# Patient Record
Sex: Male | Born: 1935 | Race: White | Hispanic: No | Marital: Married
Health system: Southern US, Community
[De-identification: ages and names within clinical notes are randomized; demographics above are authoritative.]

---

## 2003-02-11 ENCOUNTER — Other Ambulatory Visit: Payer: Self-pay

## 2005-06-28 ENCOUNTER — Ambulatory Visit: Payer: Self-pay | Admitting: Internal Medicine

## 2005-07-23 ENCOUNTER — Ambulatory Visit: Payer: Self-pay | Admitting: Internal Medicine

## 2006-01-31 ENCOUNTER — Ambulatory Visit: Payer: Self-pay | Admitting: Internal Medicine

## 2007-12-27 ENCOUNTER — Emergency Department: Payer: Self-pay | Admitting: Emergency Medicine

## 2007-12-27 ENCOUNTER — Other Ambulatory Visit: Payer: Self-pay

## 2008-12-08 ENCOUNTER — Ambulatory Visit: Payer: Self-pay | Admitting: Unknown Physician Specialty

## 2008-12-14 ENCOUNTER — Ambulatory Visit: Payer: Self-pay | Admitting: Unknown Physician Specialty

## 2009-03-08 ENCOUNTER — Ambulatory Visit: Payer: Self-pay | Admitting: Internal Medicine

## 2009-06-21 ENCOUNTER — Ambulatory Visit: Payer: Self-pay | Admitting: Internal Medicine

## 2009-07-12 ENCOUNTER — Ambulatory Visit: Payer: Self-pay | Admitting: Internal Medicine

## 2009-07-18 ENCOUNTER — Ambulatory Visit: Payer: Self-pay | Admitting: Internal Medicine

## 2009-08-22 ENCOUNTER — Ambulatory Visit: Payer: Self-pay | Admitting: Neurology

## 2009-10-23 ENCOUNTER — Ambulatory Visit: Payer: Self-pay | Admitting: Gastroenterology

## 2010-07-21 ENCOUNTER — Emergency Department: Payer: Self-pay | Admitting: Emergency Medicine

## 2010-07-25 ENCOUNTER — Encounter: Payer: Self-pay | Admitting: Internal Medicine

## 2010-08-08 ENCOUNTER — Encounter: Payer: Self-pay | Admitting: Internal Medicine

## 2010-11-08 ENCOUNTER — Other Ambulatory Visit: Payer: Self-pay | Admitting: Internal Medicine

## 2010-12-05 ENCOUNTER — Ambulatory Visit: Payer: Self-pay | Admitting: Internal Medicine

## 2011-02-08 ENCOUNTER — Ambulatory Visit: Payer: Self-pay | Admitting: Internal Medicine

## 2011-04-07 ENCOUNTER — Inpatient Hospital Stay: Payer: Self-pay | Admitting: Internal Medicine

## 2011-04-10 ENCOUNTER — Ambulatory Visit: Payer: Self-pay | Admitting: Internal Medicine

## 2011-04-10 LAB — BASIC METABOLIC PANEL
Anion Gap: 14 (ref 7–16)
BUN: 37 mg/dL — ABNORMAL HIGH (ref 7–18)
Chloride: 106 mmol/L (ref 98–107)
Co2: 21 mmol/L (ref 21–32)
Creatinine: 1.44 mg/dL — ABNORMAL HIGH (ref 0.60–1.30)
EGFR (African American): >60
EGFR (Non-African Amer.): 50 — ABNORMAL LOW
Glucose: 101 mg/dL — ABNORMAL HIGH (ref 65–99)
Potassium: 3.7 mmol/L (ref 3.5–5.1)
Sodium: 141 mmol/L (ref 136–145)

## 2011-04-10 LAB — CBC WITH DIFFERENTIAL/PLATELET
Basophil #: 0 10*3/uL (ref 0.0–0.1)
Basophil %: 0 %
Eosinophil #: 0 10*3/uL (ref 0.0–0.7)
HCT: 33.9 % — ABNORMAL LOW (ref 40.0–52.0)
HGB: 11.2 g/dL — ABNORMAL LOW (ref 13.0–18.0)
Lymphocyte #: 0.1 10*3/uL — ABNORMAL LOW (ref 1.0–3.6)
Lymphocyte %: 1.7 %
MCHC: 33.1 g/dL (ref 32.0–36.0)
Monocyte %: 2.1 %
Neutrophil #: 5.6 10*3/uL (ref 1.4–6.5)
Platelet: 96 10*3/uL — ABNORMAL LOW (ref 150–440)
RDW: 14.6 % — ABNORMAL HIGH (ref 11.5–14.5)

## 2011-04-11 LAB — BASIC METABOLIC PANEL
Anion Gap: 12 (ref 7–16)
BUN: 42 mg/dL — ABNORMAL HIGH (ref 7–18)
Calcium, Total: 8.2 mg/dL — ABNORMAL LOW (ref 8.5–10.1)
EGFR (African American): >60
EGFR (Non-African Amer.): >60
Glucose: 132 mg/dL — ABNORMAL HIGH (ref 65–99)
Osmolality: 299 (ref 275–301)
Potassium: 3.9 mmol/L (ref 3.5–5.1)

## 2011-04-11 LAB — CBC WITH DIFFERENTIAL/PLATELET
Basophil %: 0 %
Eosinophil %: 0 %
HCT: 33.6 % — ABNORMAL LOW (ref 40.0–52.0)
Lymphocyte #: 0.3 10*3/uL — ABNORMAL LOW (ref 1.0–3.6)
MCV: 93 fL (ref 80–100)
Monocyte #: 0.2 10*3/uL (ref 0.0–0.7)
Monocyte %: 3.2 %
Neutrophil #: 7.2 10*3/uL — ABNORMAL HIGH (ref 1.4–6.5)
Platelet: 97 10*3/uL — ABNORMAL LOW (ref 150–440)
RBC: 3.63 10*6/uL — ABNORMAL LOW (ref 4.40–5.90)
WBC: 7.8 10*3/uL (ref 3.8–10.6)

## 2011-04-12 LAB — CBC WITH DIFFERENTIAL/PLATELET
Basophil #: 0 10*3/uL (ref 0.0–0.1)
Eosinophil #: 0 10*3/uL (ref 0.0–0.7)
Eosinophil %: 0 %
Lymphocyte %: 2.5 %
MCH: 30.5 pg (ref 26.0–34.0)
MCV: 92 fL (ref 80–100)
Monocyte %: 3.9 %
Neutrophil #: 8.4 10*3/uL — ABNORMAL HIGH (ref 1.4–6.5)
Neutrophil %: 93.6 %
Platelet: 111 10*3/uL — ABNORMAL LOW (ref 150–440)
RBC: 3.68 10*6/uL — ABNORMAL LOW (ref 4.40–5.90)
RDW: 15.6 % — ABNORMAL HIGH (ref 11.5–14.5)
WBC: 8.9 10*3/uL (ref 3.8–10.6)

## 2011-04-12 LAB — BASIC METABOLIC PANEL
Anion Gap: 10 (ref 7–16)
BUN: 48 mg/dL — ABNORMAL HIGH (ref 7–18)
Calcium, Total: 8.5 mg/dL (ref 8.5–10.1)
Co2: 23 mmol/L (ref 21–32)
Creatinine: 1.3 mg/dL (ref 0.60–1.30)
EGFR (African American): >60
EGFR (Non-African Amer.): 56 — ABNORMAL LOW
Glucose: 101 mg/dL — ABNORMAL HIGH (ref 65–99)
Sodium: 147 mmol/L — ABNORMAL HIGH (ref 136–145)

## 2011-04-13 LAB — COMPREHENSIVE METABOLIC PANEL
Alkaline Phosphatase: 14 U/L — ABNORMAL LOW (ref 50–136)
Anion Gap: 10 (ref 7–16)
BUN: 54 mg/dL — ABNORMAL HIGH (ref 7–18)
Bilirubin,Total: 0.4 mg/dL (ref 0.2–1.0)
Chloride: 113 mmol/L — ABNORMAL HIGH (ref 98–107)
Creatinine: 1.28 mg/dL (ref 0.60–1.30)
EGFR (African American): >60
Glucose: 84 mg/dL (ref 65–99)
SGOT(AST): 53 U/L — ABNORMAL HIGH (ref 15–37)
SGPT (ALT): 38 U/L
Sodium: 148 mmol/L — ABNORMAL HIGH (ref 136–145)
Total Protein: 5.5 g/dL — ABNORMAL LOW (ref 6.4–8.2)

## 2011-04-13 LAB — CBC WITH DIFFERENTIAL/PLATELET
Basophil #: 0 10*3/uL (ref 0.0–0.1)
Basophil %: 0 %
Eosinophil #: 0 10*3/uL (ref 0.0–0.7)
Eosinophil %: 0 %
HGB: 11.1 g/dL — ABNORMAL LOW (ref 13.0–18.0)
Lymphocyte %: 8.7 %
MCH: 30.7 pg (ref 26.0–34.0)
Monocyte #: 0.6 10*3/uL (ref 0.0–0.7)
Neutrophil %: 83.3 %
RBC: 3.63 10*6/uL — ABNORMAL LOW (ref 4.40–5.90)
RDW: 15.1 % — ABNORMAL HIGH (ref 11.5–14.5)
WBC: 7.5 10*3/uL (ref 3.8–10.6)

## 2011-04-17 LAB — CBC WITH DIFFERENTIAL/PLATELET
Basophil #: 0 10*3/uL (ref 0.0–0.1)
Basophil %: 0.2 %
Eosinophil #: 0.2 10*3/uL (ref 0.0–0.7)
Eosinophil %: 3.3 %
HCT: 39 % — ABNORMAL LOW (ref 40.0–52.0)
HGB: 12.9 g/dL — ABNORMAL LOW (ref 13.0–18.0)
Lymphocyte #: 1 10*3/uL (ref 1.0–3.6)
MCH: 30.5 pg (ref 26.0–34.0)
MCV: 92 fL (ref 80–100)
Monocyte %: 7.5 %
Neutrophil #: 5.7 10*3/uL (ref 1.4–6.5)
Platelet: 171 10*3/uL (ref 150–440)
RDW: 14.9 % — ABNORMAL HIGH (ref 11.5–14.5)
WBC: 7.6 10*3/uL (ref 3.8–10.6)

## 2011-04-17 LAB — BASIC METABOLIC PANEL
Anion Gap: 12 (ref 7–16)
BUN: 32 mg/dL — ABNORMAL HIGH (ref 7–18)
Chloride: 107 mmol/L (ref 98–107)
Co2: 22 mmol/L (ref 21–32)
Creatinine: 1.1 mg/dL (ref 0.60–1.30)
EGFR (African American): >60
Potassium: 3.5 mmol/L (ref 3.5–5.1)
Sodium: 141 mmol/L (ref 136–145)

## 2011-04-21 LAB — CBC WITH DIFFERENTIAL/PLATELET
Eosinophil #: 0.2 10*3/uL (ref 0.0–0.7)
Eosinophil %: 3.7 %
HCT: 35.3 % — ABNORMAL LOW (ref 40.0–52.0)
HGB: 11.7 g/dL — ABNORMAL LOW (ref 13.0–18.0)
Lymphocyte #: 1.2 10*3/uL (ref 1.0–3.6)
Lymphocyte %: 26.7 %
MCH: 30.7 pg (ref 26.0–34.0)
MCHC: 33.2 g/dL (ref 32.0–36.0)
Monocyte %: 7.4 %
Neutrophil #: 2.7 10*3/uL (ref 1.4–6.5)
Neutrophil %: 61.8 %
Platelet: 177 10*3/uL (ref 150–440)
RDW: 15.2 % — ABNORMAL HIGH (ref 11.5–14.5)

## 2011-04-21 LAB — COMPREHENSIVE METABOLIC PANEL
Albumin: 2.5 g/dL — ABNORMAL LOW (ref 3.4–5.0)
Anion Gap: 10 (ref 7–16)
Calcium, Total: 8 mg/dL — ABNORMAL LOW (ref 8.5–10.1)
Chloride: 108 mmol/L — ABNORMAL HIGH (ref 98–107)
Co2: 26 mmol/L (ref 21–32)
Creatinine: 1.1 mg/dL (ref 0.60–1.30)
EGFR (African American): >60
EGFR (Non-African Amer.): >60
Glucose: 79 mg/dL (ref 65–99)
Potassium: 3.4 mmol/L — ABNORMAL LOW (ref 3.5–5.1)
SGOT(AST): 25 U/L (ref 15–37)
Total Protein: 5.1 g/dL — ABNORMAL LOW (ref 6.4–8.2)

## 2011-04-23 LAB — CBC WITH DIFFERENTIAL/PLATELET
Basophil %: 0.7 %
Eosinophil %: 3.9 %
HGB: 11.4 g/dL — ABNORMAL LOW (ref 13.0–18.0)
Lymphocyte #: 1.5 10*3/uL (ref 1.0–3.6)
MCH: 30.4 pg (ref 26.0–34.0)
Monocyte %: 7 %
Neutrophil #: 2.7 10*3/uL (ref 1.4–6.5)
RBC: 3.75 10*6/uL — ABNORMAL LOW (ref 4.40–5.90)
WBC: 4.8 10*3/uL (ref 3.8–10.6)

## 2011-04-23 LAB — BASIC METABOLIC PANEL
Anion Gap: 12 (ref 7–16)
BUN: 7 mg/dL (ref 7–18)
Co2: 27 mmol/L (ref 21–32)
Creatinine: 1.12 mg/dL (ref 0.60–1.30)
EGFR (African American): >60
Osmolality: 286 (ref 275–301)

## 2011-05-07 ENCOUNTER — Inpatient Hospital Stay: Payer: Self-pay | Admitting: Student

## 2011-05-07 LAB — COMPREHENSIVE METABOLIC PANEL
Anion Gap: 11 (ref 7–16)
BUN: 41 mg/dL — ABNORMAL HIGH (ref 7–18)
Bilirubin,Total: 0.8 mg/dL (ref 0.2–1.0)
Calcium, Total: 9 mg/dL (ref 8.5–10.1)
Co2: 28 mmol/L (ref 21–32)
EGFR (African American): 50 — ABNORMAL LOW
EGFR (Non-African Amer.): 41 — ABNORMAL LOW
Glucose: 96 mg/dL (ref 65–99)
Osmolality: 301 (ref 275–301)
SGPT (ALT): 25 U/L
Sodium: 146 mmol/L — ABNORMAL HIGH (ref 136–145)

## 2011-05-07 LAB — CBC
HGB: 13.9 g/dL (ref 13.0–18.0)
MCH: 30.8 pg (ref 26.0–34.0)
RBC: 4.5 10*6/uL (ref 4.40–5.90)

## 2011-05-07 LAB — URINALYSIS, COMPLETE
Bilirubin,UR: NEGATIVE
Ph: 5 (ref 4.5–8.0)
RBC,UR: 16 /HPF (ref 0–5)
Specific Gravity: 1.012 (ref 1.003–1.030)
Squamous Epithelial: <1

## 2011-05-08 LAB — COMPREHENSIVE METABOLIC PANEL
Alkaline Phosphatase: 26 U/L — ABNORMAL LOW (ref 50–136)
Anion Gap: 11 (ref 7–16)
BUN: 36 mg/dL — ABNORMAL HIGH (ref 7–18)
Bilirubin,Total: 0.6 mg/dL (ref 0.2–1.0)
Chloride: 110 mmol/L — ABNORMAL HIGH (ref 98–107)
Co2: 25 mmol/L (ref 21–32)
Creatinine: 1.35 mg/dL — ABNORMAL HIGH (ref 0.60–1.30)
EGFR (African American): >60
EGFR (Non-African Amer.): 54 — ABNORMAL LOW
Osmolality: 299 (ref 275–301)
Potassium: 4.2 mmol/L (ref 3.5–5.1)
Sodium: 146 mmol/L — ABNORMAL HIGH (ref 136–145)
Total Protein: 6.1 g/dL — ABNORMAL LOW (ref 6.4–8.2)

## 2011-05-08 LAB — CK TOTAL AND CKMB (NOT AT ARMC)
CK, Total: 210 U/L (ref 35–232)
CK, Total: 143 U/L (ref 35–232)
CK-MB: 3.3 ng/mL (ref 0.5–3.6)
CK-MB: 2.6 ng/mL (ref 0.5–3.6)

## 2011-05-08 LAB — CBC WITH DIFFERENTIAL/PLATELET
Eosinophil #: 0.1 10*3/uL (ref 0.0–0.7)
Eosinophil %: 2.5 %
Lymphocyte #: 0.8 10*3/uL — ABNORMAL LOW (ref 1.0–3.6)
Lymphocyte %: 19 %
MCH: 30.7 pg (ref 26.0–34.0)
MCHC: 33.1 g/dL (ref 32.0–36.0)
Monocyte #: 0.4 10*3/uL (ref 0.0–0.7)
Neutrophil %: 69.6 %
Platelet: 153 10*3/uL (ref 150–440)
RDW: 17.3 % — ABNORMAL HIGH (ref 11.5–14.5)

## 2011-05-08 LAB — TROPONIN I: Troponin-I: 0.03 ng/mL

## 2011-05-08 LAB — MAGNESIUM: Magnesium: 1.6 mg/dL — ABNORMAL LOW

## 2011-05-08 LAB — POTASSIUM: Potassium: 4.5 mmol/L (ref 3.5–5.1)

## 2011-05-10 LAB — BASIC METABOLIC PANEL
Anion Gap: 13 (ref 7–16)
Calcium, Total: 8.2 mg/dL — ABNORMAL LOW (ref 8.5–10.1)
Chloride: 105 mmol/L (ref 98–107)
Co2: 23 mmol/L (ref 21–32)
EGFR (African American): >60
Osmolality: 282 (ref 275–301)
Potassium: 3.6 mmol/L (ref 3.5–5.1)

## 2011-05-10 LAB — MAGNESIUM: Magnesium: 1.8 mg/dL

## 2011-05-10 LAB — URINE CULTURE

## 2011-05-11 ENCOUNTER — Ambulatory Visit: Payer: Self-pay | Admitting: Internal Medicine

## 2011-06-08 ENCOUNTER — Ambulatory Visit: Payer: Self-pay | Admitting: Geriatric Medicine

## 2011-07-09 DEATH — deceased

## 2011-11-14 IMAGING — RF DG UGI W/O KUB
1 series · 5 of 5 positions shown · non-contrast
Comparison: none

REASON FOR EXAM: abd pain nausea
COMMENTS:

PROCEDURE:     FL  - FL UPPER GI  - June 21, 2009  [DATE]
RESULT:     Comparison: None
INDICATION: Abdominal pain
TECHNIQUE: Multiple single and double-contrast images of the esophagus were
obtained under fluoroscopic guidance. Total fluoroscopy time was 0.2 minutes.

[Series 1: run · 2 acquisitions, 5 frames shown]
[im 1/2]
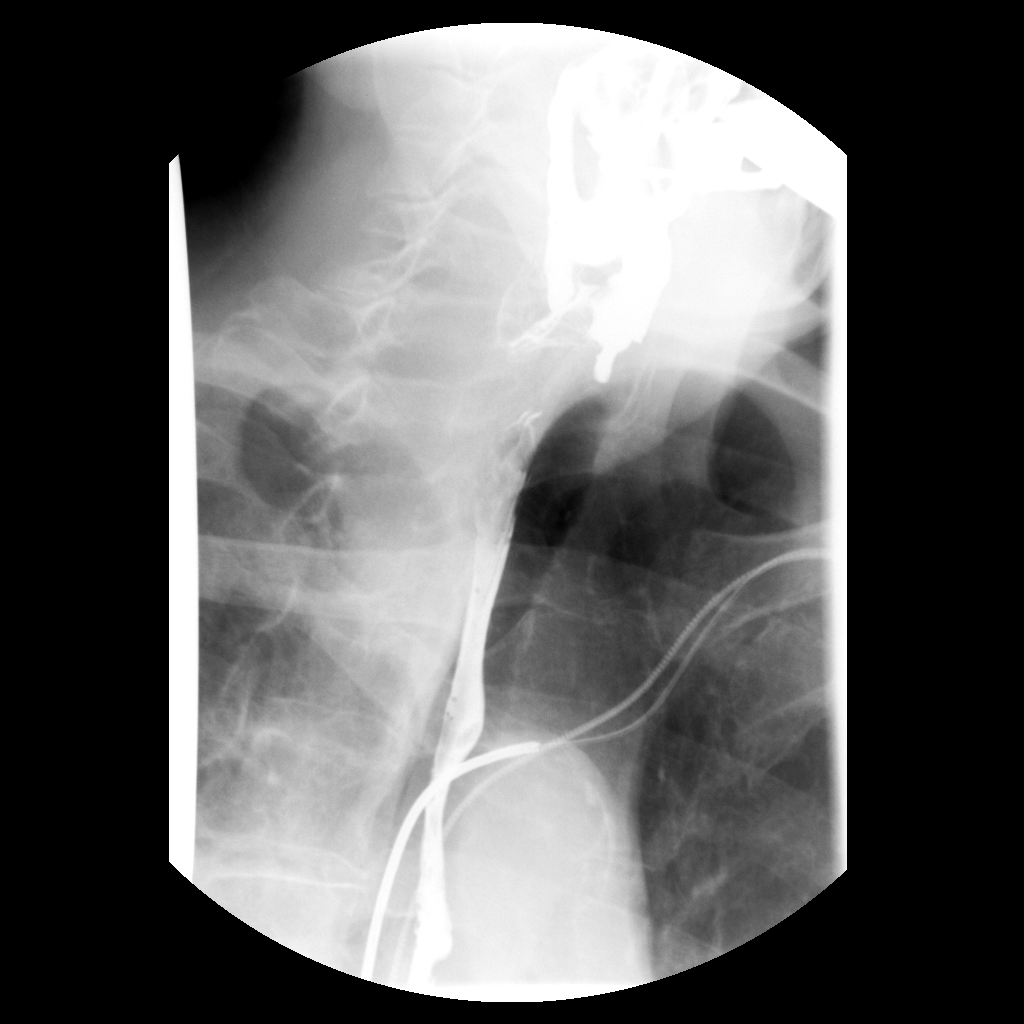
[im 1/2]
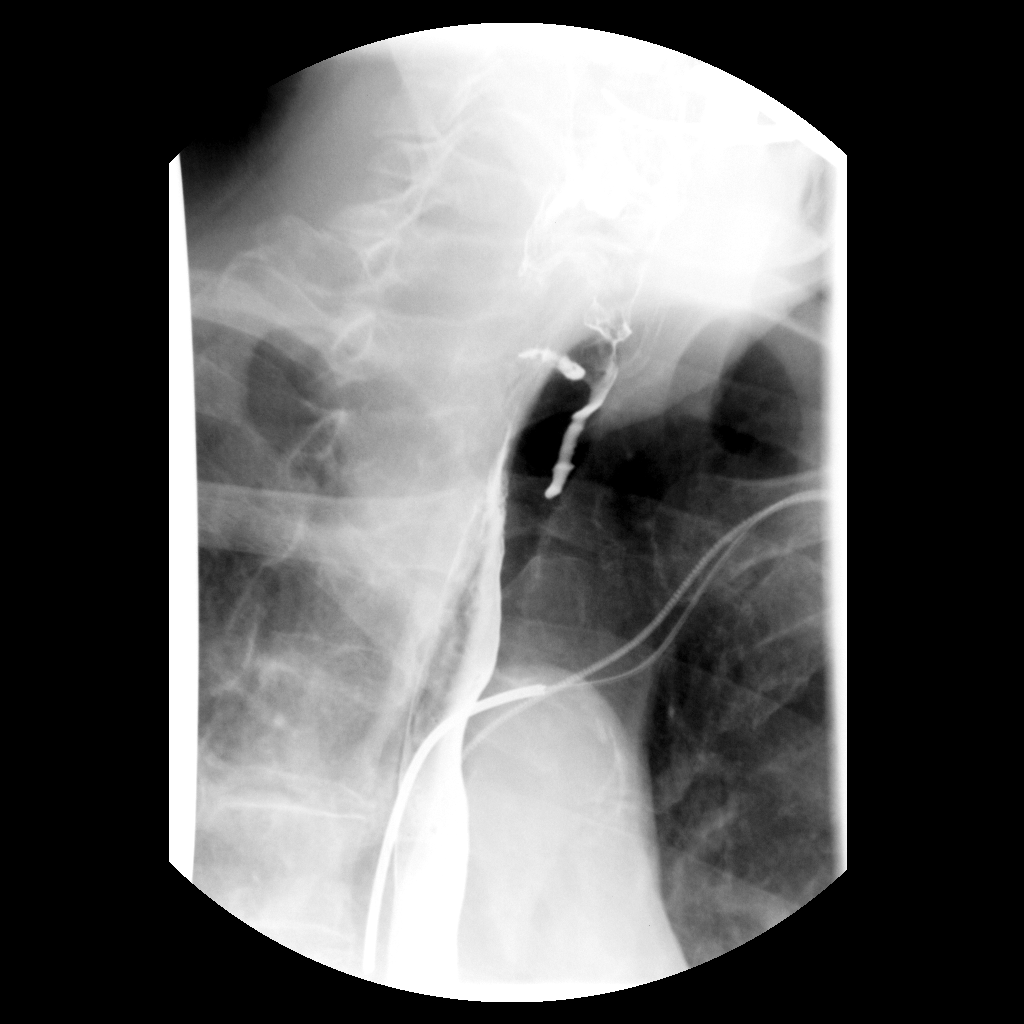
[im 1/2]
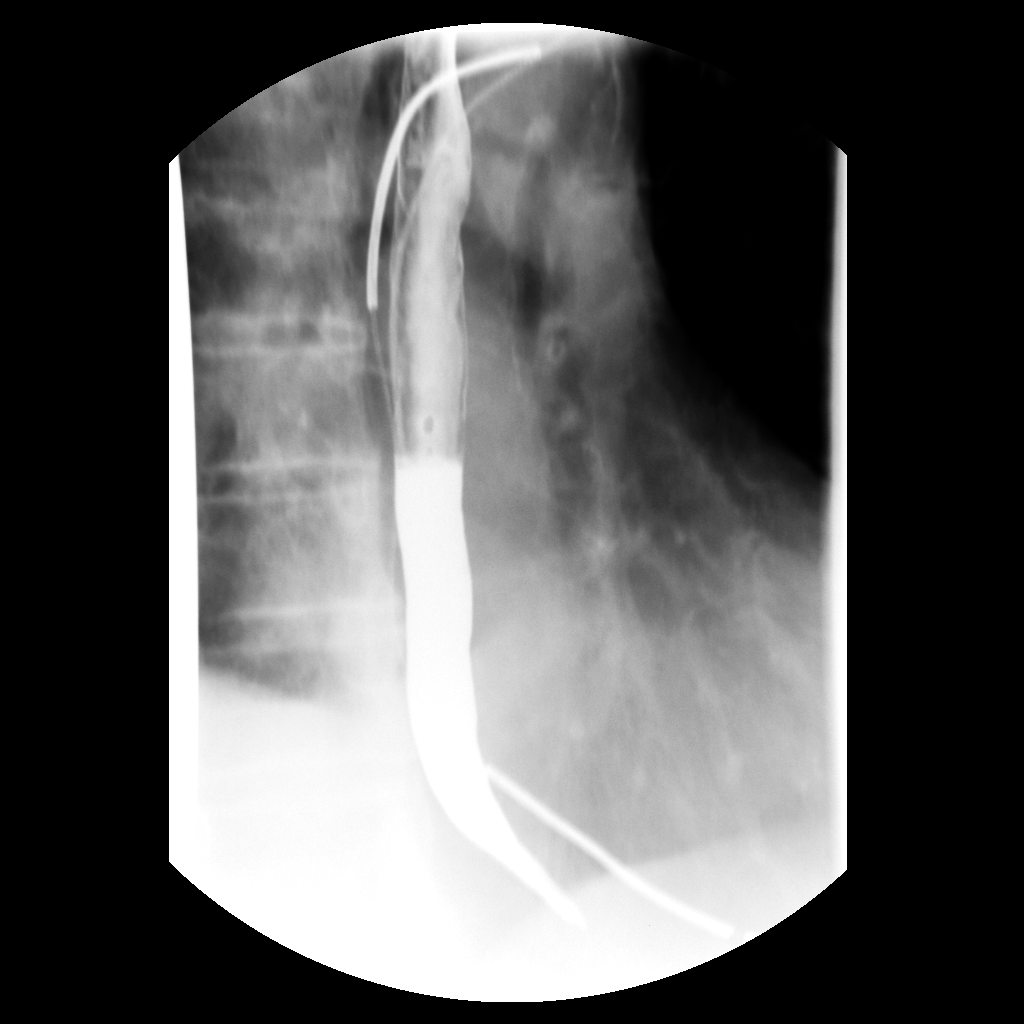
[im 1/2]
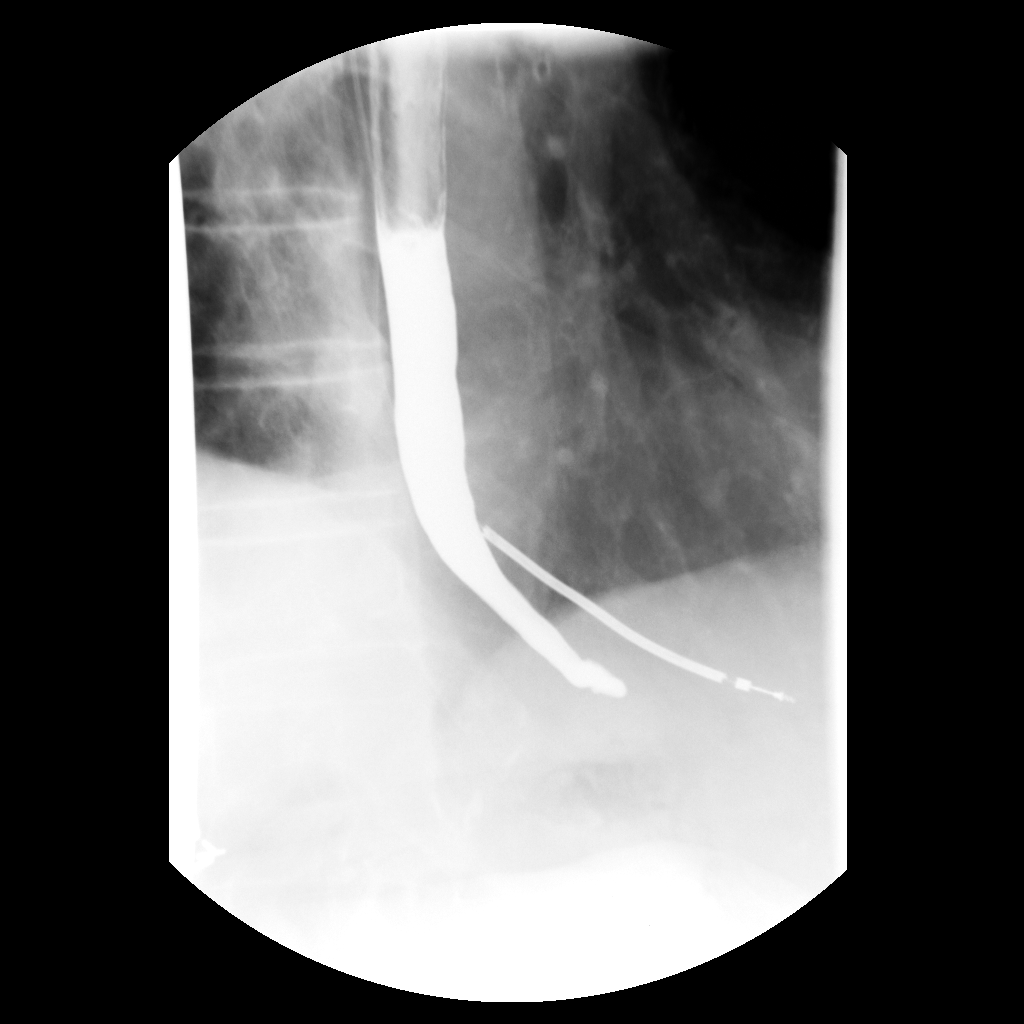
[im 2/2]
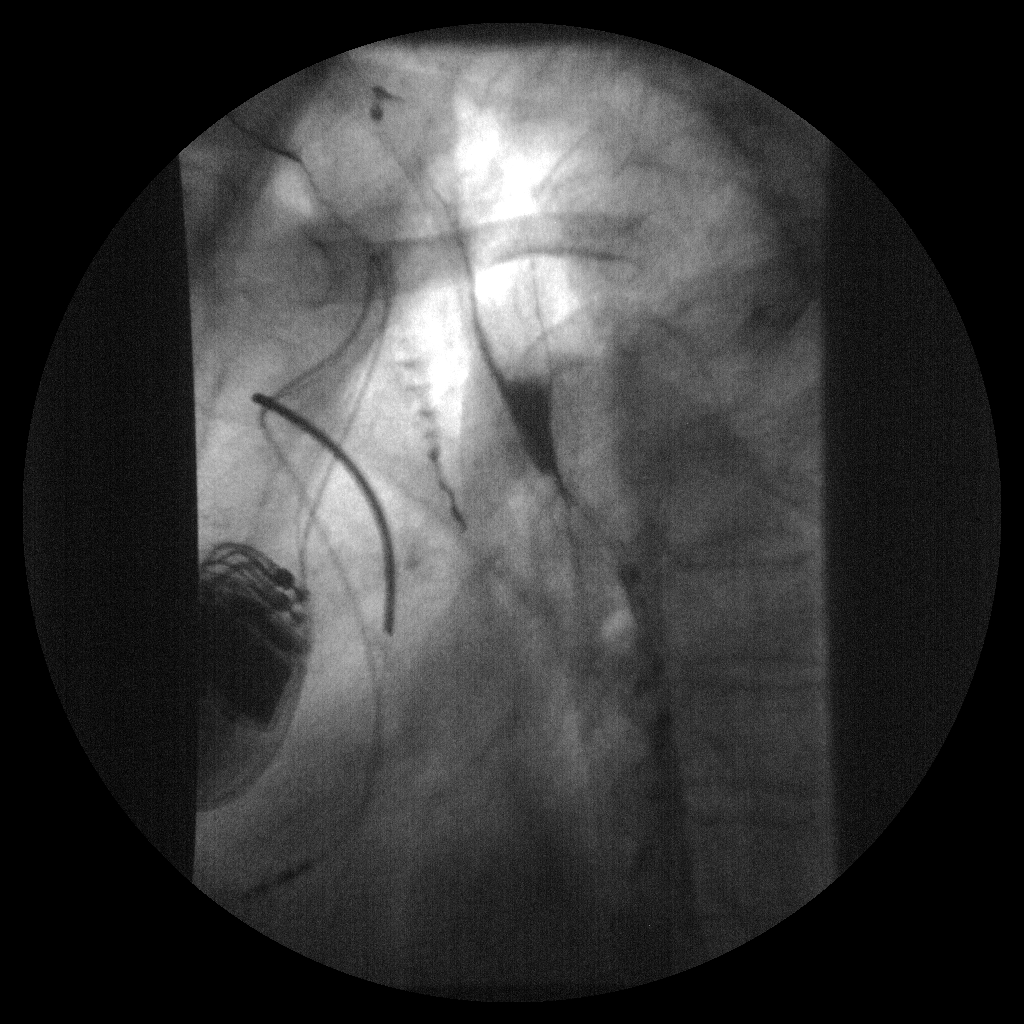

[5 of 5 positions shown; findings below may reference images not displayed]

FINDINGS: There was normal pharyngeal anatomy and motility. Contrast flowed
freely through the esophagus without evidence of stricture or mass. There is
significant laryngeal penetration with tracheal aspiration on the first
swallow. At this time the examination was terminated. The patient did not
exhibit a cough reflex.
IMPRESSION: 1. There is significant laryngeal penetration with tracheal aspiration on
the first swallow. At this time the examination was terminated.  Recommend
further evaluation with a modified barium swallow with speech pathology
present.

## 2013-08-31 IMAGING — CR DG CHEST 2V
1 series · 2 of 2 positions shown · non-contrast
Comparison: none

REASON FOR EXAM: fever
COMMENTS:

PROCEDURE:     DXR - DXR CHEST PA (OR AP) AND LATERAL  - April 08, 2011  [DATE]
RESULT:     Comparison: 04/07/2011

[Series 1: pa · 0.17mm/px · 2 of 2 slices shown]
[im 1/2]
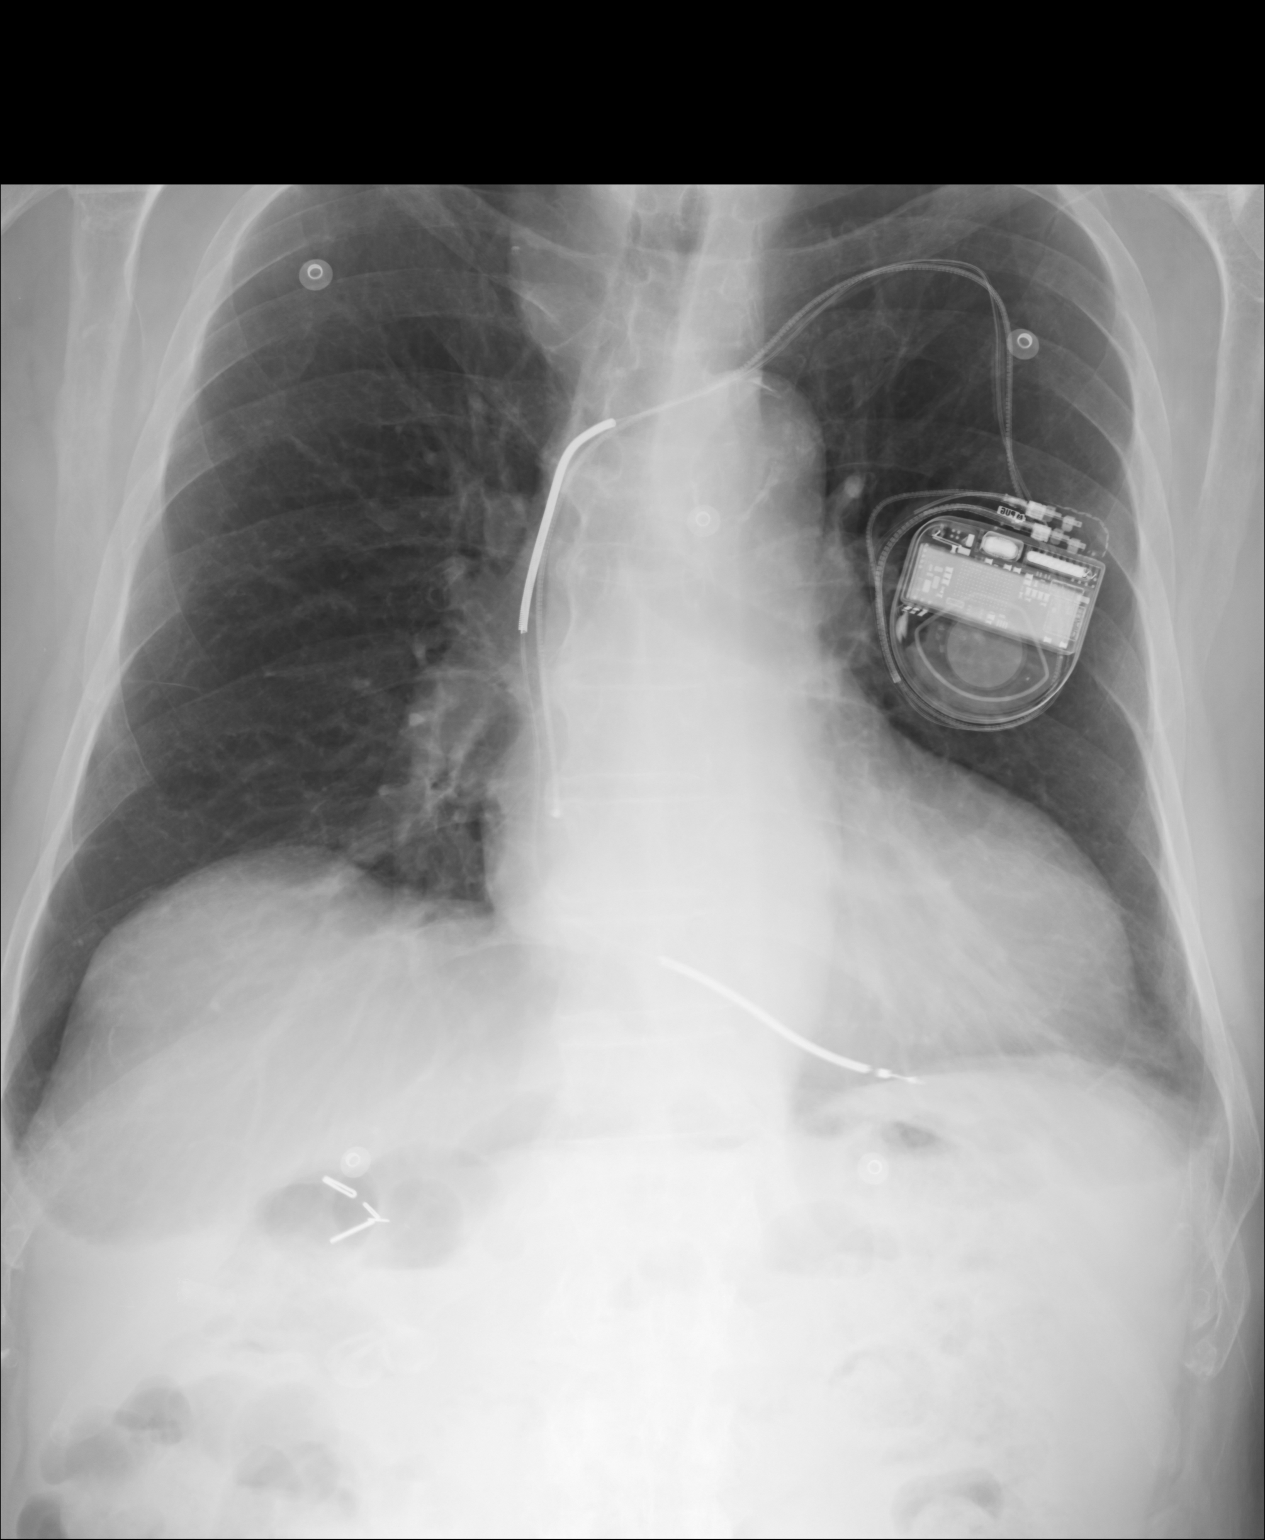
[im 2/2]
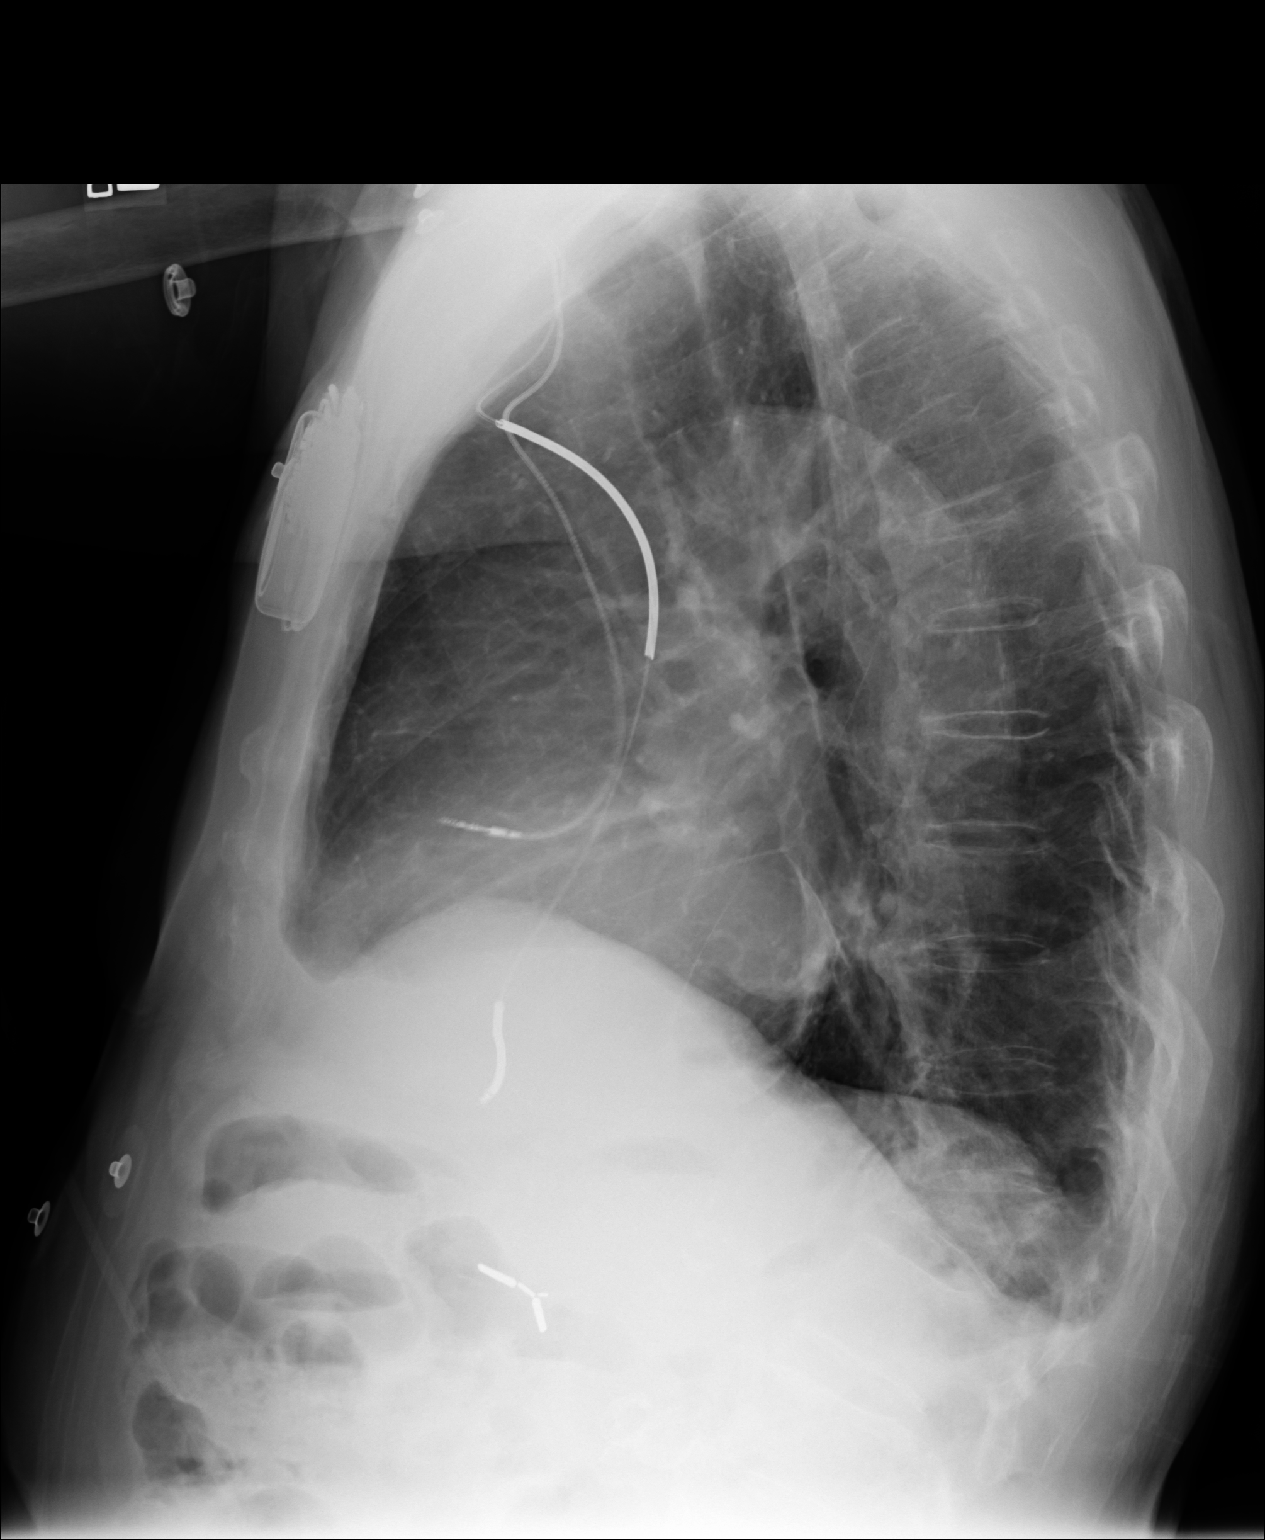

[2 of 2 positions shown; findings below may reference images not displayed]

FINDINGS: PA and lateral chest radiographs are provided.  There is no focal
parenchymal opacity, pleural effusion, or pneumothorax. The heart and
mediastinum are unremarkable. There is dual lead cardiac pacer noted. The
osseous structures are unremarkable.
IMPRESSION: No acute disease of the chest.

## 2013-09-04 IMAGING — CT CT HEAD WITHOUT CONTRAST
2 series · 15 of 30 positions shown, 19 images · non-contrast
Comparison: none

REASON FOR EXAM: Acute mental status change
COMMENTS:

PROCEDURE:     CT  - CT HEAD WITHOUT CONTRAST  - April 12, 2011 [DATE]
RESULT:     Comparison:  None
TECHNIQUE: Multiple axial images from the foramen magnum to the vertex were
obtained without IV contrast.

[Series 2: without · axial · non-contrast · 0.45mm/px · z∈[+1000,+1150]mm · 13 of 36 slices shown, 17 images]
[im 3/36  brain]
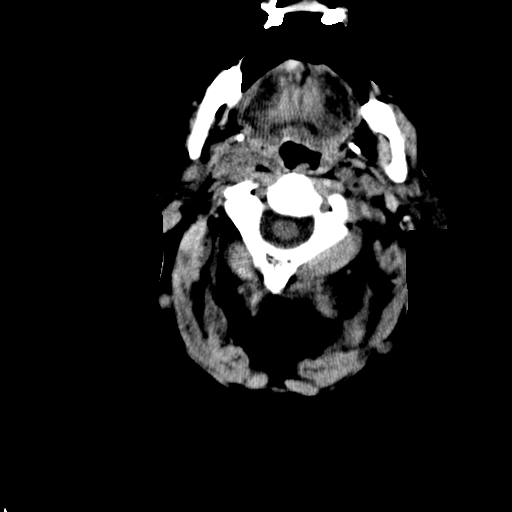
[im 3/36  bone]
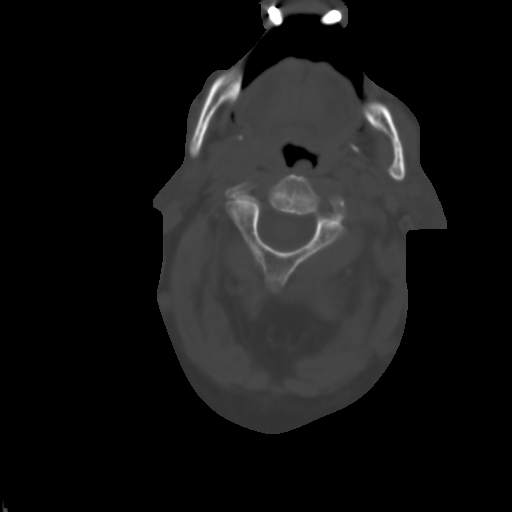
[im 6/36  brain]
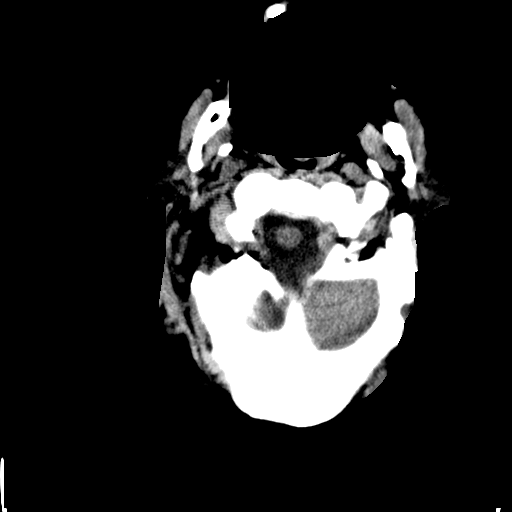
[im 8/36  brain]
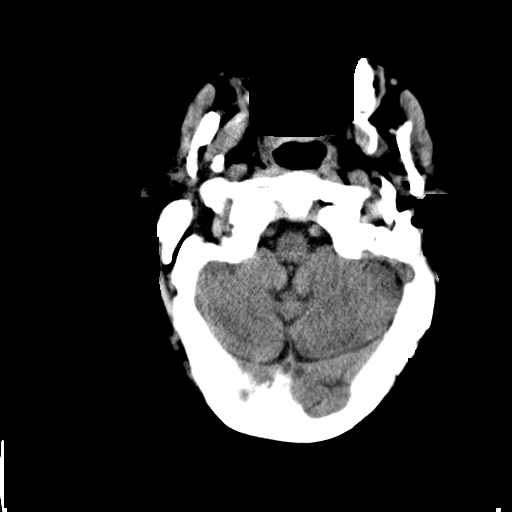
[im 11/36  brain]
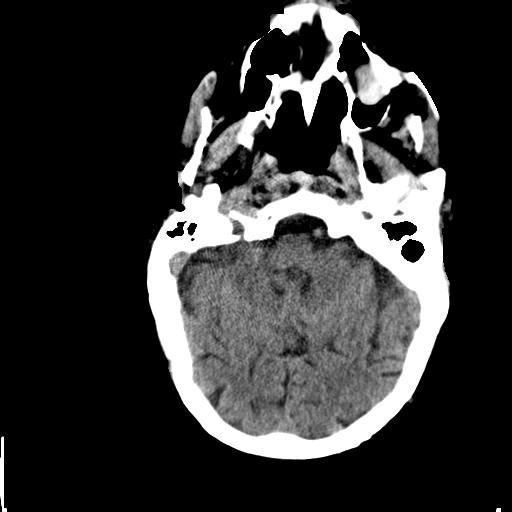
[im 13/36  brain]
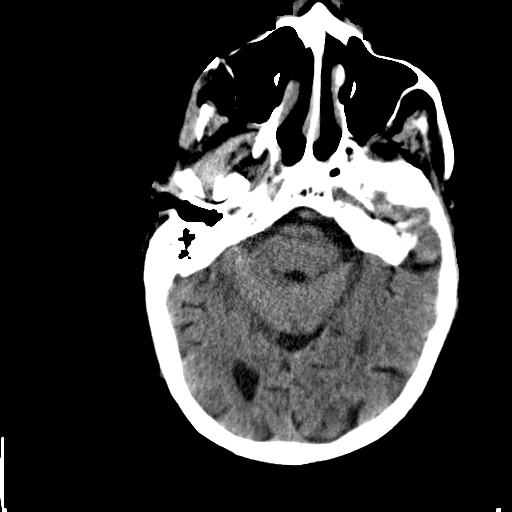
[im 13/36  bone]
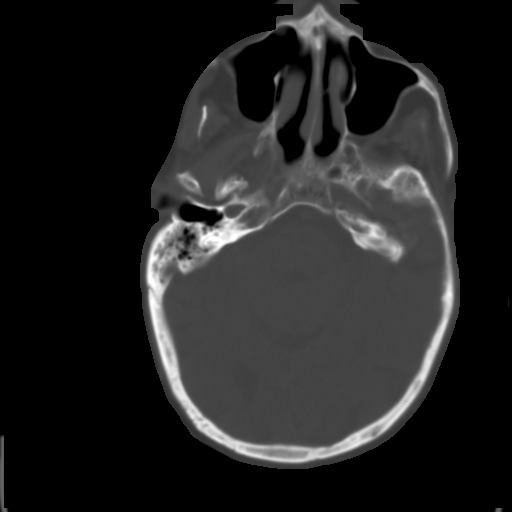
[im 16/36  brain]
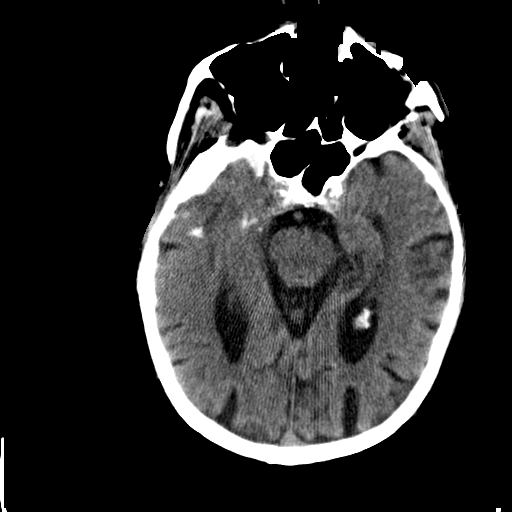
[im 18/36  brain]
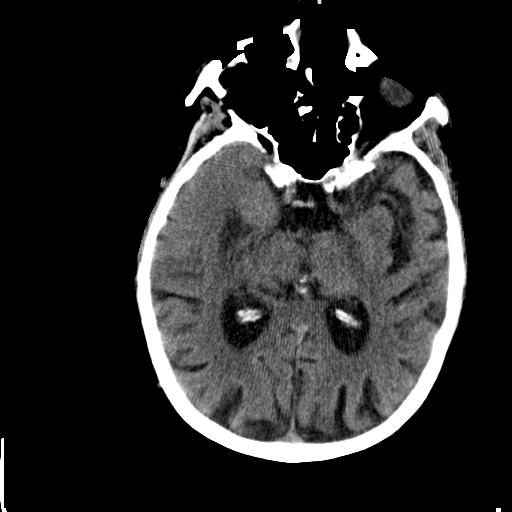
[im 21/36  brain]
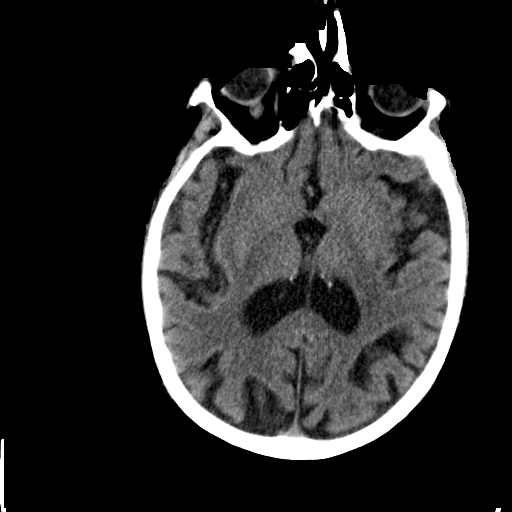
[im 23/36  brain]
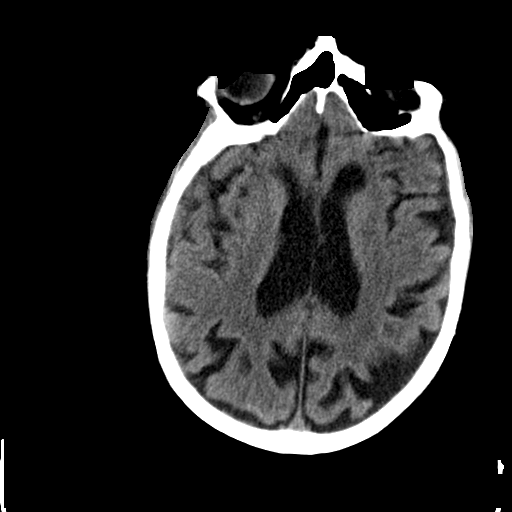
[im 23/36  bone]
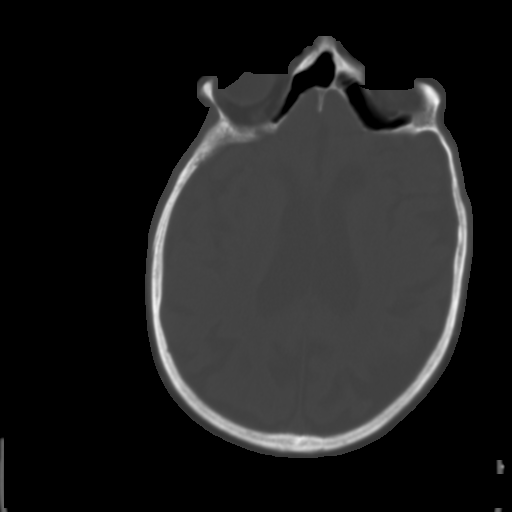
[im 26/36  brain]
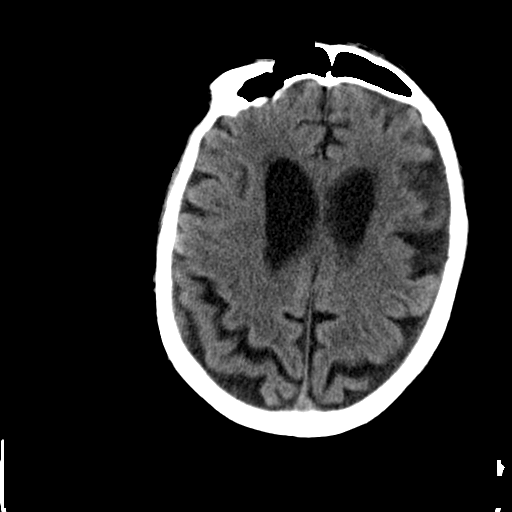
[im 28/36  brain]
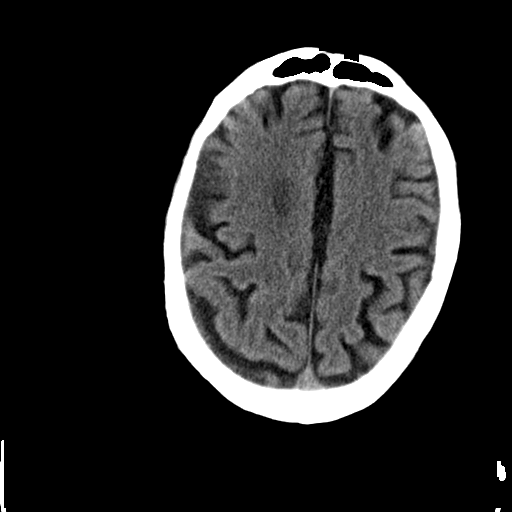
[im 31/36  brain]
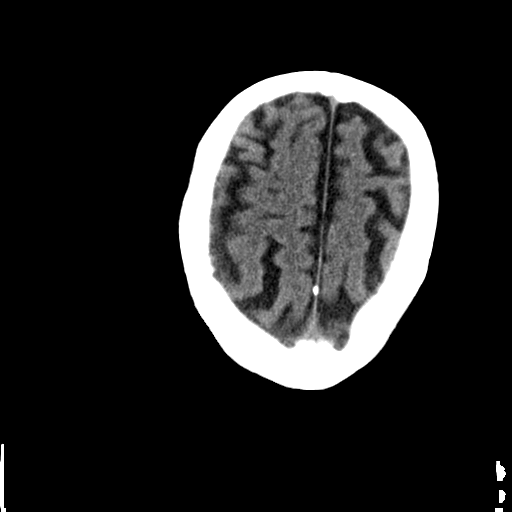
[im 33/36  brain]
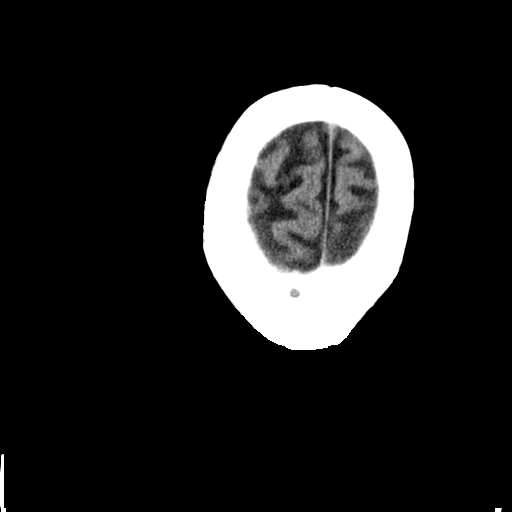
[im 33/36  bone]
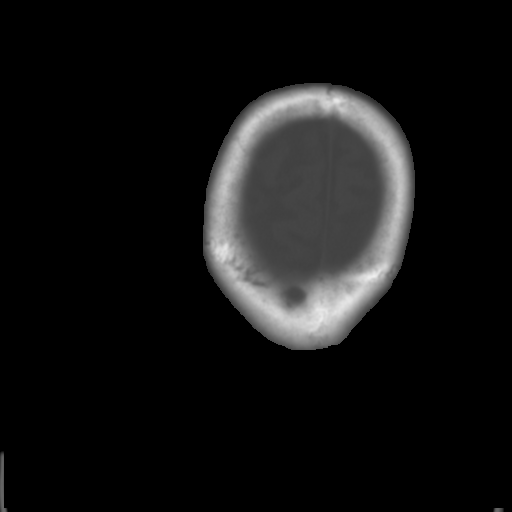

[Series 3: bone windows · axial · 0.45mm/px · z∈[+1000,+1025]mm · 2 of 36 slices shown]
[im 3/36  bone]
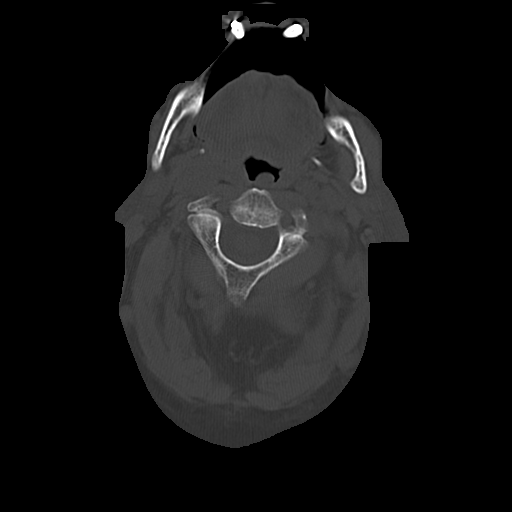
[im 8/36  bone]
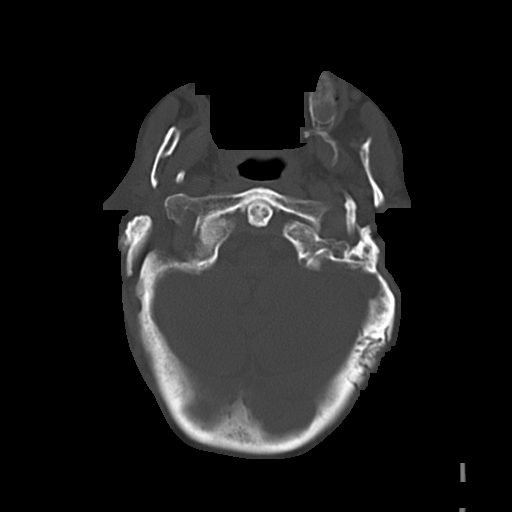

[15 of 30 positions shown; findings below may reference images not displayed]

FINDINGS: There is no evidence of mass effect, midline shift, or extra-axial fluid
collections.  There is no evidence of a space-occupying lesion or
intracranial hemorrhage. There is no evidence of a cortical-based area of
acute infarction. There is generalized cerebral atrophy. There is
periventricular white matter low attenuation likely secondary to
microangiopathy.

The ventricles and sulci are appropriate for the patient's age. The basal
cisterns are patent.

Visualized portions of the orbits are unremarkable. The visualized portions
of the paranasal sinuses and mastoid air cells are unremarkable.

The osseous structures are unremarkable.
IMPRESSION: No acute intracranial process.

## 2014-08-01 NOTE — Discharge Summary (Signed)
PATIENT NAME:  Troy Hines, Troy Hines MR#:  [id number] DATE OF BIRTH:  27-Jan-1927  DATE OF ADMISSION:  04/07/2011 DATE OF DISCHARGE:  04/23/2011  TYPE OF DISCHARGE: The patient is transferred to a skilled nursing facility.   REASON FOR ADMISSION: Fever with altered mental status and acute renal failure.   HISTORY OF PRESENT ILLNESS: The patient is an 81 year old male with a history of coronary artery disease and dementia who presented to the emergency room with lethargy, confusion, and fever. In the emergency room, the patient was found to be dehydrated and in acute renal failure. He was admitted for further evaluation.   PAST MEDICAL HISTORY:  1. Atherosclerotic cardiovascular disease status post myocardial infarction.  2. Ischemic cardiomyopathy.  3. Sick sinus syndrome status post pacemaker implant.  4. Status post defibrillator placement. 5. Alzheimer's dementia.  6. Hypothyroidism.  7. Benign prostatic hypertrophy.  8. Status post cholecystectomy.   MEDICATIONS ON ADMISSION: Please see admission note.   ALLERGIES: Cipro and streptokinase.   SOCIAL HISTORY: The patient is widowed. No history of alcohol or tobacco abuse.   FAMILY HISTORY: Positive for stroke and coronary artery disease.   REVIEW OF SYSTEMS: Unable to obtain.   PHYSICAL EXAMINATION: The patient was chronically ill-appearing and in no acute distress. Vital signs were remarkable for a temperature of 101.4 with a blood pressure of 140/54, heart rate of 62, and a respiratory rate of 24. HEENT exam was unremarkable. Neck was supple without jugular venous distention. Lungs revealed scattered rhonchi. Cardiac exam revealed a regular rate and rhythm, normal S1 and S2. Abdomen was soft and nontender. Extremities were without edema. Neurologic exam was grossly nonfocal.   HOSPITAL COURSE: The patient was admitted with a fever, dehydration, and acute renal failure. He was started on IV fluids with empiric IV antibiotics and Xopenex  and Atrovent SVNs. Cultures, chest x-ray, and urinalysis were nondiagnostic. Because of the patient's examination, he was felt to have acute bronchitis. He was treated for such with resolution of his symptoms. His renal function improved with hydration. However, during this hospitalization, the patient developed severe delirium with worsening mental status changes. He was seen by psychiatry. He was given IV Ativan and IV Haldol which subsequently led to sedation. He was tried on Geodon with no improvement of his symptoms. He was subsequently switched to Seroquel and trazodone at bedtime with dramatic improvement of his delirium and mental status changes. The patient was seen in consultation by physical therapy. He was ambulating some but required assistance. Once his mental status had improved, he was felt ready for transfer to a skilled nursing facility for further care and rehabilitation.   DISCHARGE DIAGNOSES:  1. Acute renal failure with dehydration, resolved.  2. Alzheimer's dementia.  3. Acute delirium, resolved.  4. Acute bronchitis, resolved.  5. Atherosclerotic cardiovascular disease status post myocardial infarction.  6. Chronic anemia.  7. Chronic renal insufficiency.  8. Ischemic cardiomyopathy.   DISCHARGE MEDICATIONS:  1. Synthroid 0.1 mg p.o. daily.  2. Flomax 0.4 mg p.o. daily.  3. Toprol-XL 50 mg p.o. daily.  4. Enteric-coated aspirin 81 mg p.o. daily. 5. Trazodone 50 mg p.o. at bedtime.  6. Seroquel 100 mg p.o. at bedtime.  7. Protonix 40 mg p.o. daily.  8. K-Dur 20 milliequivalents p.o. twice a day. 9. Haldol 1 mg p.o. every four hours p.r.n. agitation.   FOLLOW-UP PLANS AND APPOINTMENTS: The patient will be followed by the resident physician at the skilled nursing facility. He is on a mechanical soft  diet. He is a NO CODE BLUE, DO NOT RESUSCITATE. He will be seen in consultation by physical therapy and speech therapy. We will obtain a CBC and a MET-B in one week.   ____________________________ Leonie Douglas. Doy Hutching, MD jds:slb D: 04/23/2011 07:41:00 ET T: 04/23/2011 07:54:14 ET JOB#: [id number]  cc: Leonie Douglas. Doy Hutching, MD, <Dictator> Allah Reason Lennice Sites MD ELECTRONICALLY SIGNED 04/23/2011 11:37

## 2014-08-01 NOTE — H&P (Signed)
PATIENT NAME:  Troy Hines, Troy Hines MR#:  [id number] DATE OF BIRTH:  31-May-1926  DATE OF ADMISSION:  05/07/2011  REFERRING PHYSICIAN: Janalyn Harder, MD  PRIMARY CARE PHYSICIAN: Glenetta Borg, MD  REASON FOR ADMISSION: Altered mental status with acute renal failure.   HISTORY OF PRESENT ILLNESS: The patient is an 81 year old male with past medical history as listed below including Alzheimer's dementia, coronary artery disease status post myocardial infarction with ischemic cardiomyopathy, status post automatic implantable cardiac defibrillator, history of sick sinus syndrome status post permanent pacemaker insertion, and history of hypothyroidism who was recently hospitalized at [facility] from 04/07/2011 to 04/23/2011 for fever attributed to bronchitis and also was noted to have ARF and dehydration at that time as well as acute delirium and was discharged to [facility] on 04/23/2011. He was sent back from the [facility] today due to being poorly responsive and with altered mental status worse than his baseline, and the patient was "not acting himself", per his nephew who accompanies him in the ER today. The patient is currently nonverbal on my exam and was agitated earlier and received some Ativan, per the ER physician, and is currently sedated and unable to provide any history or review of systems. History was obtained from speaking with the ER physician and from speaking with the patient's nephew, Troy Hines, who is in the ER with the patient today, and also from speaking with the patient's power of attorney, Troy Hines, who also accompanies the patient today. Per nephew and power of attorney, the patient is normally confused at baseline, normally verbalizes, but has insensible speech at baseline. He does require assistance with all of his activities of daily living. Today the patient was noted to be poorly responsive and he was not acting himself, per his nephew, and was nonverbal where as usually he is verbal at  baseline. He was sent to the ER for further evaluation. Noncontrast head CT was obtained which was negative for acute intracranial abnormalities. The patient was noted to have acute renal failure with elevated BUN at 41 and creatinine elevated at 1.70. His renal function was normal with normal creatinine of 1.12, when he was recently discharged from the hospital, on 04/23/2011. He appears volume depleted on examination with extremely dry oral mucous membranes and poor skin turgor. Clinically he appears to be dehydrated. Thereafter hospitalist services were contacted for evaluation and for hospital admission.    PAST SURGICAL HISTORY:  1. Automatic implantable cardiac defibrillator insertion.  2. Permanent pacemaker insertion.  3. Cholecystectomy.   PAST MEDICAL HISTORY:  1. History of coronary artery disease status post myocardial infarction. 2. Ischemic cardiomyopathy status post automatic implantable cardiac defibrillator. 3. Sick sinus syndrome status post permanent pacemaker insertion.  4. Alzheimer's dementia. 5. Hypothyroidism. 6. Benign prostatic hypertrophy.  7. Questionable chronic renal insufficiency, however, his creatinine was normal at 1.12 from 04/23/2011 upon recent hospital discharge.  8. Chronic anemia.  9. 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  10. Recent hospitalization at [facility] from 04/07/2011 to 04/23/2011 for fever attributed to bronchitis, and the patient also had acute renal failure with dehydration at that time, and was also noted to have acute delirium and became too sedated with Ativan and Haldol and Geodon did not help, but delirium responded well to Seroquel and trazodone.  11. DO NOT RESUSCITATE.  ALLERGIES: Cipro and streptokinase.   MEDICATIONS: From review of recent discharge summary and also  from review of MAR from the skilled nursing facility.  1. Synthroid 100 mcg daily.   2. Flomax 0.4 mg daily.  3. Toprol-XL 50 mg daily. 4. Aspirin 81 mg daily.  5. Trazodone 50 mg at bedtime. 6. Seroquel 100 mg at bedtime.  7. Protonix 40 mg daily. 8. K-Dur 20 milliequivalents twice a day. 9. Haldol 1 mg p.o. every four hours p.r.n. agitation.   FAMILY HISTORY: Per old chart review, history is positive for CVAs and coronary artery disease.  SOCIAL HISTORY: Per old chart review, there is no history of tobacco, alcohol, or illicit drug use. The patient is widowed and comes in accompanied by one nephew today, Troy Hines, and his power of attorney is Troy Hines who also accompanies him today in the ER.   REVIEW OF SYSTEMS: Unobtainable from the patient as he has altered mental status and is currently nonverbal and also sedated after Ativan.   PHYSICAL EXAMINATION:   VITAL SIGNS: Temperature 99.7, pulse 75, blood pressure 143/71, respirations 18, oxygen saturation 95% on room air. Oxygen saturations 99% on room air.   GENERAL: The patient is sedated. He withdraws to tactile stimuli and does not respond to verbal command. He is currently nonverbal on my examination, but moving all his extremities spontaneously.   HEENT: Normocephalic, atraumatic. Pupils are equally round and reactive to light and accommodation. I cannot access his extraocular muscle function currently. Anicteric sclerae. Conjunctivae are pink. He does not respond to voice command. Oral mucosa is extremely dry but without any lesions noted.   NECK: Supple. No jugular venous distention, lymphadenopathy, or carotid bruits bilaterally. No thyromegaly or tenderness to palpation over the thyroid gland. Trachea midline.   CHEST: Normal respiratory effort without use of accessory respiratory muscles. Lungs are clear to auscultation bilaterally without crackles, rales, or wheezing.   CARDIOVASCULAR: S1 and S2 positive, regular rate and rhythm. No murmurs, rubs, or gallops. PMI is slightly laterally  displaced.  ABDOMEN: Soft, nontender, and nondistended. Normoactive bowel sounds. No hepatosplenomegaly or palpable masses. No hernia.   EXTREMITIES: No clubbing, cyanosis, or edema. Pedal pulses are palpable bilaterally.   SKIN: No suspicious rashes. Skin turgor is poor.   LYMPH: No cervical lymphadenopathy.   NEUROLOGICAL: The patient is sedated after he received Ativan. He is currently moving all of his extremities spontaneously. He does not follow any verbal command and he does withdraw to tactile stimuli. It is difficult to perform full neurologic evaluation as the patient is sedated.   PSYCHIATRIC: Difficult to assess the patient's affect as he is sedated, but earlier in the ER he was noted to have been agitated and very fidgety, per the ER staff.   LABS/STUDIES: Noncontrast head CT: No acute intracranial abnormalities are noted. There are mild periventricular changes noted consistent with chronic ischemia.   CK 324, CK-MB 5.2, and troponin 0.03.   CBC is within normal limits.   Complete metabolic panel: Sodium 146, potassium 4.6, chloride 107, bicarbonate 28, BUN 41, creatinine 1.7, glucose 96, calcium 28, anion gap 11, total protein 7.1, albumin 3.5, total bilirubin 0.8, AST 41, ALT 25, alkaline phosphatase 30.   Urinalysis: Hazy urine, specific gravity 1.02, negative nitrite and leukocyte esterase, 11 WBCs, and trace bacteria   EKG reveals normal sinus rhythm with heart rate 72 beats per minute with left axis deviation and nonspecific ST and T wave changes. No acute ischemic change is noted.   ASSESSMENT AND PLAN: An 81 year old male with past medical history of Alzheimer's dementia, hypothyroidism,  coronary artery disease status post myocardial infarction with ischemic cardiomyopathy, status post automatic implantable cardiac defibrillator, sick sinus syndrome, status post permanent pacemaker insertion, hypothyroidism, benign prostatic hypertrophy, Alzheimer's dementia, and recent  hospitalization for fever with bronchitis, and also dehydration with acute renal failure here with: 1. Altered mental status - I suspect from dehydration and metabolic encephalopathy as well as acute renal failure and possible urinary tract infection contributing as well. We will rehydrate the patient with IV fluids and reassess his mental status. Noncontrast head CT is negative for acute intracranial abnormalities. Hopefully with rehydration his mental status will improve back to baseline. We will also obtain frequent neuro checks. The plan of care was discussed with the patient's power of attorney and also nephew in the ER both whom are in agreement with conservative management for now and supportive care. We will see if patient responds to IV fluids and IV antibiotics. 2. Acute renal failure - suspect this is prerenal from volume depletion. We will follow the patient's renal function closely with hydration and avoid nephrotoxins. We will reassess the patient's renal function in the a.m. posthydration.  3. Dehydration - could be from poor oral intake. We will rehydrate the patient with IV fluids and reassess. We will also get speech therapy evaluation and keep him on a dysphagia diet for now.  4. Mild hypernatremia - suspect this is hypovolemic. As above we will rehydrate the patient with IV fluids and reassess sodium level in the a.m. thereafter.  5. Mild urinary tract infection -  the patient has pyuria with 11 WBCs in the urine and trace bacteria, although his nitrite and leukocyte esterase are negative. We will obtain a urine culture and start empiric IV antibiotics in the form of Rocephin and monitor clinical response.  6. History of coronary artery disease with ischemic cardiomyopathy status post ASCVD - continue aspirin and beta blocker for history of coronary artery disease. In terms of his ischemic cardiomyopathy, he currently appears on the volume deplete side and there is no role for diuretics. I  would not keep him on any ACE inhibitors at this time as well given that he has acute renal failure. We will continue to cycle the patient's cardiac enzymes. His CK and CK-MB levels are elevated, but his first troponin is negative. I am not sure if this represents rhabdomyolysis or if this could be medication induced. We will follow the patient's muscle enzyme levels with hydration, also check TSH.  7. Hypothyroidism - Continue Synthroid. Check TSH and adjust accordingly.  8. History of sick sinus syndrome, status post permanent pacemaker insertion - continue Toprol-XL and monitor the patient on off unit telemetry.  9. Alzheimer's dementia - The patient is not on any specific medications for this condition at baseline.  10. Benign prostatic hypertrophy - continue Flomax.  11. Deep vein thrombosis prophylaxis - subcutaneous heparin.              CODE STATUS: DO NOT RESUSCITATE. (This was confirmed by the patient's power of attorney, Troy Troy Hines, in the ER, and also confirmed on paperwork sent from the skilled nursing facility).   TIME SPENT ON ADMISSION: Approximately 45 minutes. ____________________________ Elon AlasKamran N. Srihith Aquilino, MD knl:slb D: 05/07/2011 21:53:03 ET T: 05/08/2011 07:02:24 ET JOB#: [id number]  cc: Elon AlasKamran N. Kyannah Climer, MD, <Dictator> Glenetta BorgMaria-Dorina Sevilla, MD Elon AlasKAMRAN N Athanasia Stanwood MD ELECTRONICALLY SIGNED 05/27/2011 3:25

## 2014-08-01 NOTE — Consult Note (Signed)
PATIENT NAME:  Troy Hines, CLASS MR#:  [id number] DATE OF BIRTH:  [birth year]  DATE OF CONSULTATION:  04/12/2011  REFERRING PHYSICIAN:   CONSULTING PHYSICIAN:  Audery Amel, MD  IDENTIFYING INFORMATION AND CHIEF COMPLAINT: This is an 81 year old man in the hospital because of acute mental status change and fever. Consult because of prolonged agitation and mental status change.   HISTORY OF PRESENT ILLNESS: Information was obtained from the chart and examining the patient and also from interviewing his private duty nurse who has been staying with him daily for quite some time. She gave very useful history. The patient presented to the emergency room at our hospital on April 07, 2011, with complaints of acute mental status change. He was febrile at the time. Workup showed some worsening of his renal function. Usual labs were done to evaluate for source of infection. He was treated empirically. Labs at this point show that his renal function has improved. He has a mild anemia, but that is probably chronic. He has negative blood cultures, and his urinalysis does not appear infected. The chest x-rays are questionable, possible infection but not necessarily an obvious pneumonia. Fever has improved. Despite this, the patient continues to be delirious and agitated. He has been combative, confused. I spoke with a private duty nurse who reports that at baseline up until Friday he had actually been quite functional in his home. He was able to hold a reasonable conversation, although he clearly has some dementia. He was able to feed himself and participate in his activities of daily living. He was able to enjoy conversation with her as well as some activities around the house. She reports that on Friday she found him to have an abruptly and dramatically changed mental state. She said that he was stripping off his clothes, was combative, extremely confused. This was clearly an abrupt change. She said that for a few  days before that he had seemed like he might have been a little bit out of sorts but it was nonspecific. There had not been apparently any known trauma or fall recently. There had not been any acute change in medication.   PAST HISTORY: The patient has documented dementia. He had a history of wandering away from home a couple of years ago. His wife passed away about a year and a half ago. Recently he has required 24-hour private duty nursing in order to stay at home to prevent wandering. The nurse reports that he will frequently get confused and forget that his wife has died or that other people are no longer around and will seem confused about time but that he generally consistently recognizes her and tends to interact pretty appropriately. She does not know of any other psychiatric history. I see that an EEG was done a few years ago and there is note with it that says something about a mood disorder but she does not know of any history of depression and I do not see any other reference to it and he is not on any medication for psychiatric illness.   The patient has a history of cardiac disease with a past history of an MI. History of chronic arrhythmias with both a pacemaker and defibrillator implanted. History of hypothyroidism. Prosthetic hypertrophy and a history of swallowing difficulties.   MEDICATIONS: On admission he was taking Toprol-XL 50 mg a day, Synthroid 100 mcg a day, Restoril 15 mg p.r.n. at bedtime, Ultram 50 mg every six hours as needed for pain, Flomax 400  mcg a day, Lasix 20 mg a day. Since coming into the hospital, he has continued with these medicines. Also has been given Haldol and Ativan for agitation. Also has been on high-dose steroids for his presumed lung infection.   REVIEW OF SYSTEMS: The patient was not able to answer questions or give a review of systems. It was not clear whether he was in pain, but he did not appear to be indicating an acute pain anywhere. Otherwise  noncontributory.   MENTAL STATUS EXAM: The patient was examined in his hospital room with a private duty nurse still present. His eyes were only slightly open. He was drowsy most of the time. Unless I spoke directly to him, he did not appear to be attending to my presence or reacting. He moved his hands around aimlessly although when the nurse held his hands, he would leave them be. When I spoke to him loudly his name he would turn towards me and briefly indicate that he had heard me. He was not, however, able to answer any specific questions. He was also not able to respond to even simple commands such as opening his eyes or moving a particular hand. No other mental status exam could be performed.    ASSESSMENT: This is an 81 year old man with a documented chronic level of dementia who had a clearly abrupt onset of delirium last Friday leading to hospitalization. He has remained delirious during his hospital stay. Workup so far has not shown very much in the way of clear new diagnoses.  Possible viral lung infection. Probably a little dehydrated. Otherwise, so far nothing specific. Question is whether there is a clear cause of the new onset delirium as well as appropriate treatment.   TREATMENT PLAN: I would go ahead and order a CAT scan without contrast to evaluate for a possible new bleed. It is possible that he may have fallen or just had a spontaneous hemorrhagic stroke or other bleed in his brain, which could account for the new onset abrupt delirium. It is also possible that he may have had an ischemic stroke. If this happened Friday, a CAT scan could possibly show some sign of it although it still might not be visible on CT scan. I would not yet however, order an MRI. MRI is much more difficult and harder for him to tolerate and ischemic stroke may not have an acute treatment at this point anyway. At this point, I do not think there is any other particular work-up I would order that they are not  already doing. I would suggest as far as treatment that the Ativan be discontinued. Benzodiazepines tend to worsen delirium, although they may acutely sedate a person they will long term make the delirium worse. I would continue using the Haldol. I am going to order a standing dose of 2 mg of IV Haldol every 12 hours to try and control the delirium. I will leave the p.r.n. order in place if needed. Recommend continued having a sitter with him as he is at risk of hurting himself by accident. Leave the lights on during the day and try and provide stimulation during the night.  Keep him on a regular day and night schedule. I realize that the steroids may be necessary for treating him at this point, but they are likely contributing to confusion as well. I would just suggest tapering them as soon as is reasonably safe and appropriate. I will continue to follow.   DIAGNOSIS PRINCIPLE AND PRIMARY:  AXIS I: Delirium of unknown, probably mixed etiology.   SECONDARY DIAGNOSES:  AXIS I: Dementia, probably Alzheimer's type and/or vascular.  AXIS II: No diagnosis.  AXIS III: Multiple medical problems as noted above.  History of heart disease, hypothyroidism, arrhythmias, swallowing difficulties.  AXIS IV: Moderate to severe chronic stress from impairment and illness.  AXIS V: Functioning at time of evaluation about 15.     ____________________________ Audery Amel, MD jtc:vtd D: 04/12/2011 11:06:00 ET T: 04/12/2011 11:45:03 ET JOB#: [id number]  cc: Audery Amel, MD, <Dictator> Audery Amel MD ELECTRONICALLY SIGNED 04/13/2011 13:27

## 2014-08-01 NOTE — Consult Note (Signed)
Details:    - Psychiatry: Patient was seen today for followup. Today he was more alert and awake. He was able to answer some questions. However, he is still disoriented. Also, I am told that he had a difficult night last night. the CAT scan did not show any sign of bleed or mass or stroke. Overall, he seems to be a little bit better than he was yesterday. The only change make is to add 5 mg of Ambien  for sleep at night.I hope this will give him a better night of rest. No other change to treatment. I will continue to followup if he is in the hospital after  the weekend.   Electronic Signatures: Audery Amellapacs, Pailyn Bellevue T (MD)  (Signed 04-Jan-13 14:54)  Authored: Details   Last Updated: 04-Jan-13 14:54 by Audery Amellapacs, Nikitas Davtyan T (MD)

## 2014-08-01 NOTE — Consult Note (Signed)
Brief Consult Note: Diagnosis: delirium unknown eitiology, dementia prob right ear and/or vascular.   Patient was seen by consultant.   Consult note dictated.   Recommend further assessment or treatment.   Orders entered.   Comments: Psychiatry: Patient seen. Acute onset delirium this past Friday. Longstanding history of dementia, but nurse gives clear hx of acute change Friday. Dx in hosp so far mainly a prob viral infection. Still delirious.  Will go ahead and order head CT to evaluate for bleed. Patient is at risk of stroke and may have had one. CT might or might not show subacute stroke but I'll defer MRI at least until post CT due to increaseed difficulty of pt tolerating MRI and that an ischemic stroke may  be less treatable. Will DC ativan as it usually worsenes delirium. Add standing Haldol to try to control delirium but leave prn Haldol, too. Steroids may be contributing but I realise they may be needed for infection. Try to taper them as soon as safe. Will follow.  Electronic Signatures: Audery Amellapacs, Dorell Gatlin T (MD)  (Signed 03-Jan-13 10:51)  Authored: Brief Consult Note   Last Updated: 03-Jan-13 10:51 by Audery Amellapacs, Damaris Abeln T (MD)

## 2014-08-01 NOTE — Consult Note (Signed)
Details:    - Psychiatry: Patient seen. Spoke with his long-time caregiver and with nursing staff. Reviewed medication. Currently patient was awake and was passively cooperative with care. He was not lashing out or being combative. He made eye contact and could answer his name but his responsiveness was intermittant. He could not tell me where he was or name his caregiver and his conciousness seemed to fade in and out. Recent mdeication had been mainly prn and had used benzos as well as antipsychotics. I see that standing geodon was added in the last day. Perhaps not coincidentally the caregiver and nurse report he is doing better today than he had for a couple days.  Patient still delirious and also may have suffered some injury to cause worsening of underlying dementia. I understand [facility] if there is still combative behavior.  I agree with benzos having been discontinued. I would continue to avoid them. Agree with liberal use of prn haldol iv if acutely agitated.  Adding the standing geodon probably helps. I'll increase it to 20 qam and 40 qpm.That may also provide some help with sleep.  Continue working on treating any underlying medical issues that may contribute to delirium. If he is really otherwise not needing medical treatment and still has uncontrolable behavior. we may consider gero-psych unit. Continue with keeping lights on a normal day/night schedule and providing quiet stimulation in the day and minimizing disruptions at night.   Electronic Signatures: Audery Amellapacs, Brandye Inthavong T (MD)  (Signed 08-Jan-13 14:30)  Authored: Details   Last Updated: 08-Jan-13 14:30 by Audery Amellapacs, Tylerjames Hoglund T (MD)

## 2014-08-01 NOTE — Discharge Summary (Signed)
PATIENT NAME:  Troy Hines, Troy Hines DATE OF BIRTH:  09/25/1926  DATE OF ADMISSION:  05/07/2011 DATE OF DISCHARGE:  05/14/2011  ADDENDUM:     The patient had an x-ray of the chest done yesterday on 02/03 which did not show any evidence for pneumonia. It appears that the right-sided pneumonia seen previously has resolved. Today is day seven of the antibiotics and the patient is afebrile and has no significant hypoxia.  He still has confusion which I suspect is likely his baseline. He does have advanced dementia. His diet is sporadic. At this point he has been accepted at the nursing home. He will be discharged today with the following medications.  DISCHARGE MEDICATIONS: 1. Synthroid 100 mcg  p.o. daily. 2. Flomax 0.4 mg daily.  3. Toprol-XL 50 mg extended-release daily.  4. Aspirin 81 mg daily.  5. Trazodone 50 mg at bedtime.  6. Seroquel 100 mg at bedtime.  7. Protonix 40 mg daily. 8. K-Dur 20 mEq 2 times a day.  9. Haldol 1 mg every four hours as needed for agitation p.o.   DISCHARGE DIAGNOSES:  1. Altered mental status likely multifactorial from dehydration, advanced dementia, and pneumonia.  2. Advanced dementia.  3. Acute renal failure.  4. Pneumonia, likely aspiration.  5. Hypothyroidism.  6. Sick sinus syndrome status post pacemaker.   DIET: Mechanical soft with full aspiration precautions.   ACTIVITY: As tolerated.   FOLLOWUP:  1. Please follow up with your PCP in 1 to 2 weeks.  2. Please obtain full thyroid function tests in four weeks.   DISPOSITION: Nursing home.   CODE STATUS: The patient is DO NOT RESUSCITATE.    ____________________________ Krystal EatonShayiq Nakeysha Pasqual, MD sa:bjt D: 05/14/2011 15:18:04 ET T: 05/14/2011 15:43:45 ET JOB#: [id number]  cc: Krystal EatonShayiq Wilena Tyndall, MD, <Dictator> Krystal EatonSHAYIQ Strother Everitt MD ELECTRONICALLY SIGNED 06/01/2011 15:24

## 2014-08-01 NOTE — Consult Note (Signed)
Details:    - Psychiatry: Sorry to hear he was agitated last night. Same pattern, sleeping on and off in the day and agitated and up at night. Geodon didn't seem to make any more progresswith keeping this in check. I am going to stop the geodon and try seroquel 100mg  qhs. It is more sedating and easier to dose because there is no requirement. Leave the prn daily haldol. I understand placement problems are still an issue. At this point I'd say irt is reasonable to try gero units including gero-psych for possible acceptance. If you need to, call the psych intake nurse at [phone] about help.   Electronic Signatures: Audery Amellapacs, John T (MD)  (Signed 11-Jan-13 10:52)  Authored: Details   Last Updated: 11-Jan-13 10:52 by Audery Amellapacs, John T (MD)

## 2014-08-01 NOTE — Consult Note (Signed)
Details:    - Psychiatry: Came by today and yesterday to see him in the mornings. Today he is awake and eating. He clearly has more advanced memory problems having forgottent his wife was dead and was tearful over this but he was not agitated. It looks like he still has gotten some prn haldol late at night the last couple nioghts and I see a brief note last night that just says "agitated", but no detail.  Overall much better. Seems to be doing well in the day and only having some trouble at night. In his current condition I really don't think a gero-psych unit should be necessary. He is "sundowning" and would probably do well in a quieter and more home-like environment at night. I would suggest a trial of placement in rehab/transition to ALF. If nobody will take him I guess we could try referralto gero-psych though he really just doesn't seem that bad off in his behavior. I will try increasing the oral geodon dose at night to see if that can prevent the need for extra haldol.   Electronic Signatures: Audery Amellapacs, John T (MD)  (Signed 10-Jan-13 09:35)  Authored: Details   Last Updated: 10-Jan-13 09:35 by Audery Amellapacs, John T (MD)
